# Patient Record
Sex: Female | Born: 1995 | Race: White | Hispanic: No
Health system: Southern US, Community
[De-identification: ages and names within clinical notes are randomized; demographics above are authoritative.]

---

## 2021-02-14 DIAGNOSIS — N3001 Acute cystitis with hematuria: Secondary | ICD-10-CM | POA: Diagnosis not present

## 2021-02-14 DIAGNOSIS — R300 Dysuria: Secondary | ICD-10-CM | POA: Diagnosis not present

## 2021-02-14 DIAGNOSIS — Z113 Encounter for screening for infections with a predominantly sexual mode of transmission: Secondary | ICD-10-CM | POA: Diagnosis not present

## 2021-03-24 DIAGNOSIS — Z0000 Encounter for general adult medical examination without abnormal findings: Secondary | ICD-10-CM | POA: Diagnosis not present

## 2021-04-01 DIAGNOSIS — L720 Epidermal cyst: Secondary | ICD-10-CM | POA: Diagnosis not present

## 2021-04-17 DIAGNOSIS — H66003 Acute suppurative otitis media without spontaneous rupture of ear drum, bilateral: Secondary | ICD-10-CM | POA: Diagnosis not present

## 2021-05-02 DIAGNOSIS — L538 Other specified erythematous conditions: Secondary | ICD-10-CM | POA: Diagnosis not present

## 2021-05-02 DIAGNOSIS — Z789 Other specified health status: Secondary | ICD-10-CM | POA: Diagnosis not present

## 2021-05-02 DIAGNOSIS — L728 Other follicular cysts of the skin and subcutaneous tissue: Secondary | ICD-10-CM | POA: Diagnosis not present

## 2021-05-11 DIAGNOSIS — H6503 Acute serous otitis media, bilateral: Secondary | ICD-10-CM | POA: Diagnosis not present

## 2021-07-16 DIAGNOSIS — B349 Viral infection, unspecified: Secondary | ICD-10-CM | POA: Diagnosis not present

## 2021-07-16 DIAGNOSIS — J029 Acute pharyngitis, unspecified: Secondary | ICD-10-CM | POA: Diagnosis not present

## 2021-07-16 DIAGNOSIS — R509 Fever, unspecified: Secondary | ICD-10-CM | POA: Diagnosis not present

## 2021-07-16 DIAGNOSIS — U071 COVID-19: Secondary | ICD-10-CM | POA: Diagnosis not present

## 2021-12-22 DIAGNOSIS — J069 Acute upper respiratory infection, unspecified: Secondary | ICD-10-CM | POA: Diagnosis not present

## 2021-12-22 DIAGNOSIS — Z03818 Encounter for observation for suspected exposure to other biological agents ruled out: Secondary | ICD-10-CM | POA: Diagnosis not present

## 2021-12-22 DIAGNOSIS — Z20822 Contact with and (suspected) exposure to covid-19: Secondary | ICD-10-CM | POA: Diagnosis not present

## 2021-12-22 DIAGNOSIS — R051 Acute cough: Secondary | ICD-10-CM | POA: Diagnosis not present

## 2022-01-07 DIAGNOSIS — Z202 Contact with and (suspected) exposure to infections with a predominantly sexual mode of transmission: Secondary | ICD-10-CM | POA: Diagnosis not present

## 2022-01-07 DIAGNOSIS — Z681 Body mass index (BMI) 19 or less, adult: Secondary | ICD-10-CM | POA: Diagnosis not present

## 2022-01-07 DIAGNOSIS — B3731 Acute candidiasis of vulva and vagina: Secondary | ICD-10-CM | POA: Diagnosis not present

## 2022-01-07 DIAGNOSIS — R300 Dysuria: Secondary | ICD-10-CM | POA: Diagnosis not present

## 2022-01-18 ENCOUNTER — Emergency Department (HOSPITAL_COMMUNITY)
Admission: EM | Admit: 2022-01-18 | Discharge: 2022-01-18 | Disposition: A | Discharge status: Home / Self Care | Payer: BC Managed Care – PPO | Attending: Emergency Medicine | Admitting: Emergency Medicine

## 2022-01-18 ENCOUNTER — Emergency Department (HOSPITAL_COMMUNITY): Payer: BC Managed Care – PPO

## 2022-01-18 ENCOUNTER — Encounter (HOSPITAL_COMMUNITY): Payer: Self-pay

## 2022-01-18 DIAGNOSIS — S91311S Laceration without foreign body, right foot, sequela: Secondary | ICD-10-CM | POA: Diagnosis not present

## 2022-01-18 DIAGNOSIS — Z23 Encounter for immunization: Secondary | ICD-10-CM | POA: Insufficient documentation

## 2022-01-18 DIAGNOSIS — S91311A Laceration without foreign body, right foot, initial encounter: Secondary | ICD-10-CM | POA: Diagnosis not present

## 2022-01-18 DIAGNOSIS — S99921A Unspecified injury of right foot, initial encounter: Secondary | ICD-10-CM | POA: Diagnosis not present

## 2022-01-18 DIAGNOSIS — W208XXA Other cause of strike by thrown, projected or falling object, initial encounter: Secondary | ICD-10-CM | POA: Diagnosis not present

## 2022-01-18 MED ORDER — TETANUS-DIPHTH-ACELL PERTUSSIS 5-2.5-18.5 LF-MCG/0.5 IM SUSY
0.5000 mL | PREFILLED_SYRINGE | Freq: Once | INTRAMUSCULAR | Status: AC
  Administered 2022-01-18: 0.5 mL via INTRAMUSCULAR
  Filled 2022-01-18: qty 0.5

## 2022-01-18 MED ORDER — OXYCODONE-ACETAMINOPHEN 5-325 MG PO TABS
1.0000 | ORAL_TABLET | Freq: Once | ORAL | Status: AC
  Administered 2022-01-18: 1 via ORAL
  Filled 2022-01-18: qty 1

## 2022-01-18 MED ORDER — LIDOCAINE HCL (PF) 1 % IJ SOLN
10.0000 mL | Freq: Once | INTRAMUSCULAR | Status: AC
  Administered 2022-01-18: 10 mL
  Filled 2022-01-18: qty 30

## 2022-01-18 MED ORDER — BACITRACIN ZINC 500 UNIT/GM EX OINT
TOPICAL_OINTMENT | Freq: Two times a day (BID) | CUTANEOUS | Status: DC
  Administered 2022-01-18: 1 via TOPICAL
  Filled 2022-01-18: qty 0.9

## 2022-01-18 NOTE — ED Triage Notes (Signed)
Pt presents with c/o left 2nd and 3rd toe injury from dropping a piece of wood on her foot. Bleeding is controlled at this time.

## 2022-01-18 NOTE — ED Provider Notes (Signed)
[facility]-EMERGENCY DEPT Provider Note   CSN: [id number] Arrival date & time: 01/18/22  1846     History  Chief Complaint  Patient presents with   Extremity Laceration    Joyce Jacobson is a 26 y.o. female.  Patient with no pertinent past medical history presents today with complaints of foot injury.  She states that immediately prior to arrival earlier today, patient was moving out of her apartment and accidentally dropped a piece of wooden furniture on her right second and third toes. She states that he was wearing open toed sandals at the time.  She endorses significant pain to the second and third toes but has been able to walk with some discomfort. She denies any other injuries or complaints. She is unsure of her last tetanus.  The history is provided by the patient. No language interpreter was used.      Home Medications Prior to Admission medications   Not on File      Allergies    Patient has no known allergies.    Review of Systems   Review of Systems  Skin:  Positive for wound.  All other systems reviewed and are negative.  Physical Exam Updated Vital Signs BP 121/88 (BP Location: Left Arm)   Pulse 79   Temp 98.4 F (36.9 C) (Oral)   Resp 16   LMP 01/11/2022 (Approximate)   SpO2 100%  Physical Exam Vitals and nursing note reviewed.  Constitutional:      General: She is not in acute distress.    Appearance: Normal appearance. She is normal weight. She is not ill-appearing, toxic-appearing or diaphoretic.  HENT:     Head: Normocephalic and atraumatic.  Cardiovascular:     Rate and Rhythm: Normal rate.  Pulmonary:     Effort: Pulmonary effort is normal. No respiratory distress.  Musculoskeletal:        General: Normal range of motion.     Cervical back: Normal range of motion.     Comments: Horizontal laceration noted along the medial surface of DIP joint of the third toe. Laceration with associated skin flap noted to the  anterior surface of the second toe. No nailbed involvement. Distal sensation intact. Full ROM intact with some pain. Capillary refill less than 2 seconds. Bleeding controlled. No visualized foreign body present  Skin:    General: Skin is warm and dry.  Neurological:     General: No focal deficit present.     Mental Status: She is alert.  Psychiatric:        Mood and Affect: Mood normal.        Behavior: Behavior normal.    ED Results / Procedures / Treatments   Labs (all labs ordered are listed, but only abnormal results are displayed) Labs Reviewed - No data to display  EKG None  Radiology DG Foot Complete Right  Result Date: 01/18/2022 CLINICAL DATA:  Dropped heavy piece of furniture on right foot today, pain in the second and third toes. EXAM: RIGHT FOOT COMPLETE - 3+ VIEW COMPARISON:  None Available. FINDINGS: There is no evidence of fracture or dislocation. Particularly, no fracture of the second or third toe. There is no evidence of arthropathy or other focal bone abnormality. There is a small well corticated density in the dorsal foot overlying the metatarsals that is chronic. Soft tissues are unremarkable. IMPRESSION: No fracture or subluxation of the right foot. Electronically Signed   By: Narda Rutherford M.D.   On: 01/18/2022  19:48    Procedures .Marland Kitchen.Laceration Repair  Date/Time: 01/18/2022 11:06 PM Performed by: Silva BandySmoot, Jayke Caul A, PA-C Authorized by: Silva BandySmoot, Becket Wecker A, PA-C   Consent:    Consent obtained:  Verbal   Consent given by:  Patient   Risks, benefits, and alternatives were discussed: yes     Risks discussed:  Infection, pain, retained foreign body, need for additional repair, poor cosmetic result, tendon damage, nerve damage, poor wound healing and vascular damage   Alternatives discussed:  No treatment, delayed treatment, observation and referral Universal protocol:    Procedure explained and questions answered to patient or proxy's satisfaction: yes     Imaging  studies available: yes     Required blood products, implants, devices, and special equipment available: yes     Patient identity confirmed:  Verbally with patient Anesthesia:    Anesthesia method:  Nerve block   Block needle gauge:  25 G   Block anesthetic:  Lidocaine 1% w/o epi   Block technique:  Toe block   Block injection procedure:  Anatomic landmarks identified, introduced needle, incremental injection, anatomic landmarks palpated and negative aspiration for blood   Block outcome:  Anesthesia achieved Laceration details:    Location:  Toe   Toe location:  R second toe   Length (cm):  4   Depth (mm):  3 Exploration:    Imaging obtained: x-ray     Imaging outcome: foreign body not noted     Wound exploration: wound explored through full range of motion and entire depth of wound visualized   Treatment:    Area cleansed with:  Saline   Amount of cleaning:  Extensive   Irrigation solution:  Sterile saline   Irrigation volume:  1 L   Irrigation method:  Pressure wash Skin repair:    Repair method:  Sutures   Suture size:  4-0   Suture material:  Prolene   Suture technique:  Simple interrupted   Number of sutures:  4 Approximation:    Approximation:  Close Repair type:    Repair type:  Simple Post-procedure details:    Dressing:  Antibiotic ointment and non-adherent dressing   Procedure completion:  Tolerated well, no immediate complications .Marland Kitchen.Laceration Repair  Date/Time: 01/18/2022 11:10 PM Performed by: Silva BandySmoot, Bridgitt Raggio A, PA-C Authorized by: Silva BandySmoot, Vandy Fong A, PA-C   Consent:    Consent obtained:  Verbal   Consent given by:  Patient   Risks, benefits, and alternatives were discussed: yes     Risks discussed:  Infection, pain, retained foreign body, tendon damage, vascular damage, poor cosmetic result, poor wound healing, nerve damage and need for additional repair   Alternatives discussed:  No treatment, delayed treatment, referral and observation Universal protocol:     Procedure explained and questions answered to patient or proxy's satisfaction: yes     Imaging studies available: yes     Required blood products, implants, devices, and special equipment available: yes     Patient identity confirmed:  Verbally with patient Anesthesia:    Anesthesia method:  Nerve block   Block needle gauge:  25 G   Block anesthetic:  Lidocaine 1% w/o epi   Block technique:  Digital block   Block injection procedure:  Anatomic landmarks identified, incremental injection, negative aspiration for blood, introduced needle and anatomic landmarks palpated   Block outcome:  Anesthesia achieved Laceration details:    Location:  Toe   Toe location:  R third toe   Length (cm):  3   Depth (  mm):  2 Exploration:    Imaging obtained: x-ray     Imaging outcome: foreign body not noted     Wound exploration: wound explored through full range of motion and entire depth of wound visualized   Treatment:    Area cleansed with:  Saline   Amount of cleaning:  Standard   Irrigation solution:  Sterile saline   Irrigation volume:  500 ml   Irrigation method:  Pressure wash Skin repair:    Repair method:  Sutures   Suture size:  4-0   Suture material:  Prolene   Number of sutures:  3 Approximation:    Approximation:  Close Repair type:    Repair type:  Simple Post-procedure details:    Dressing:  Antibiotic ointment and non-adherent dressing   Procedure completion:  Tolerated well, no immediate complications    Medications Ordered in ED Medications  lidocaine (PF) (XYLOCAINE) 1 % injection 10 mL (has no administration in time range)  bacitracin ointment (has no administration in time range)  Tdap (BOOSTRIX) injection 0.5 mL (0.5 mLs Intramuscular Given 01/18/22 2128)  oxyCODONE-acetaminophen (PERCOCET/ROXICET) 5-325 MG per tablet 1 tablet (1 tablet Oral Given 01/18/22 2145)    ED Course/ Medical Decision Making/ A&P                           Medical Decision Making Amount  and/or Complexity of Data Reviewed Radiology: ordered.  Risk Prescription drug management.   Patient presents today with right second and third toe injuries.  She is afebrile, nontoxic-appearing, and in no acute distress with reassuring vital signs.  X-ray imaging obtained of the right foot which revealed no acute fracture or subluxation of the foot.  I have personally reviewed and interpreted these images and agree with radiology interpretation.  Pressure irrigation performed of toe wounds. Wound explored and base of wound visualized in a bloodless field without evidence of foreign body.  Laceration occurred < 8 hours prior to repair which was well tolerated. Tdap updated.  Pt has no comorbidities to effect normal wound healing. Pt discharged without antibiotics.  Discussed suture home care with patient and answered questions. Pt to follow-up for wound check and suture removal in 7 days; they are to return to the ED sooner for signs of infection. Pt is hemodynamically stable with no complaints prior to dc.  Discharged in stable condition.  Final Clinical Impression(s) / ED Diagnoses Final diagnoses:  Foot laceration, right, sequela    Rx / DC Orders ED Discharge Orders     None     An After Visit Summary was printed and given to the patient.     Vear Clock 01/18/22 2318    Lorre Nick, MD 01/20/22 1505

## 2022-01-18 NOTE — Discharge Instructions (Signed)
As we discussed, your work-up in the ER today was reassuring for acute abnormalities.  X-ray imaging of your foot did not show any fracture or dislocation.  7 nonabsorbable sutures were placed in your second and third toe this evening.  You will need to have these removed in 7 to 10 days.  This can be done at urgent care, your PCP, or you can return here.  In the interim, please ensure that your wounds are kept clean and dry.  You may shower normally and rinse your wounds with soap and water, just be sure to pat dry and do not scrub.  Return if development of any new or worsening symptoms.

## 2022-01-29 DIAGNOSIS — L089 Local infection of the skin and subcutaneous tissue, unspecified: Secondary | ICD-10-CM | POA: Diagnosis not present

## 2022-01-29 DIAGNOSIS — Z4802 Encounter for removal of sutures: Secondary | ICD-10-CM | POA: Diagnosis not present

## 2022-04-10 DIAGNOSIS — Z681 Body mass index (BMI) 19 or less, adult: Secondary | ICD-10-CM | POA: Diagnosis not present

## 2022-04-10 DIAGNOSIS — Z13220 Encounter for screening for lipoid disorders: Secondary | ICD-10-CM | POA: Diagnosis not present

## 2022-04-10 DIAGNOSIS — Z713 Dietary counseling and surveillance: Secondary | ICD-10-CM | POA: Diagnosis not present

## 2022-04-10 DIAGNOSIS — Z136 Encounter for screening for cardiovascular disorders: Secondary | ICD-10-CM | POA: Diagnosis not present

## 2022-04-10 DIAGNOSIS — Z131 Encounter for screening for diabetes mellitus: Secondary | ICD-10-CM | POA: Diagnosis not present

## 2022-05-16 DIAGNOSIS — N898 Other specified noninflammatory disorders of vagina: Secondary | ICD-10-CM | POA: Diagnosis not present

## 2022-05-16 DIAGNOSIS — Z7251 High risk heterosexual behavior: Secondary | ICD-10-CM | POA: Diagnosis not present

## 2022-05-16 DIAGNOSIS — N926 Irregular menstruation, unspecified: Secondary | ICD-10-CM | POA: Diagnosis not present

## 2022-07-26 DIAGNOSIS — J069 Acute upper respiratory infection, unspecified: Secondary | ICD-10-CM | POA: Diagnosis not present

## 2022-07-26 DIAGNOSIS — R051 Acute cough: Secondary | ICD-10-CM | POA: Diagnosis not present

## 2022-07-26 DIAGNOSIS — Z681 Body mass index (BMI) 19 or less, adult: Secondary | ICD-10-CM | POA: Diagnosis not present

## 2022-07-26 DIAGNOSIS — R509 Fever, unspecified: Secondary | ICD-10-CM | POA: Diagnosis not present

## 2022-12-21 IMAGING — CR DG FOOT COMPLETE 3+V*R*
3 series · 3 of 3 positions shown · non-contrast
Comparison: None Available.

CLINICAL DATA: Dropped heavy piece of furniture on right foot
today, pain in the second and third toes.

EXAM:
RIGHT FOOT COMPLETE - 3+ VIEW

[x foot ap left]
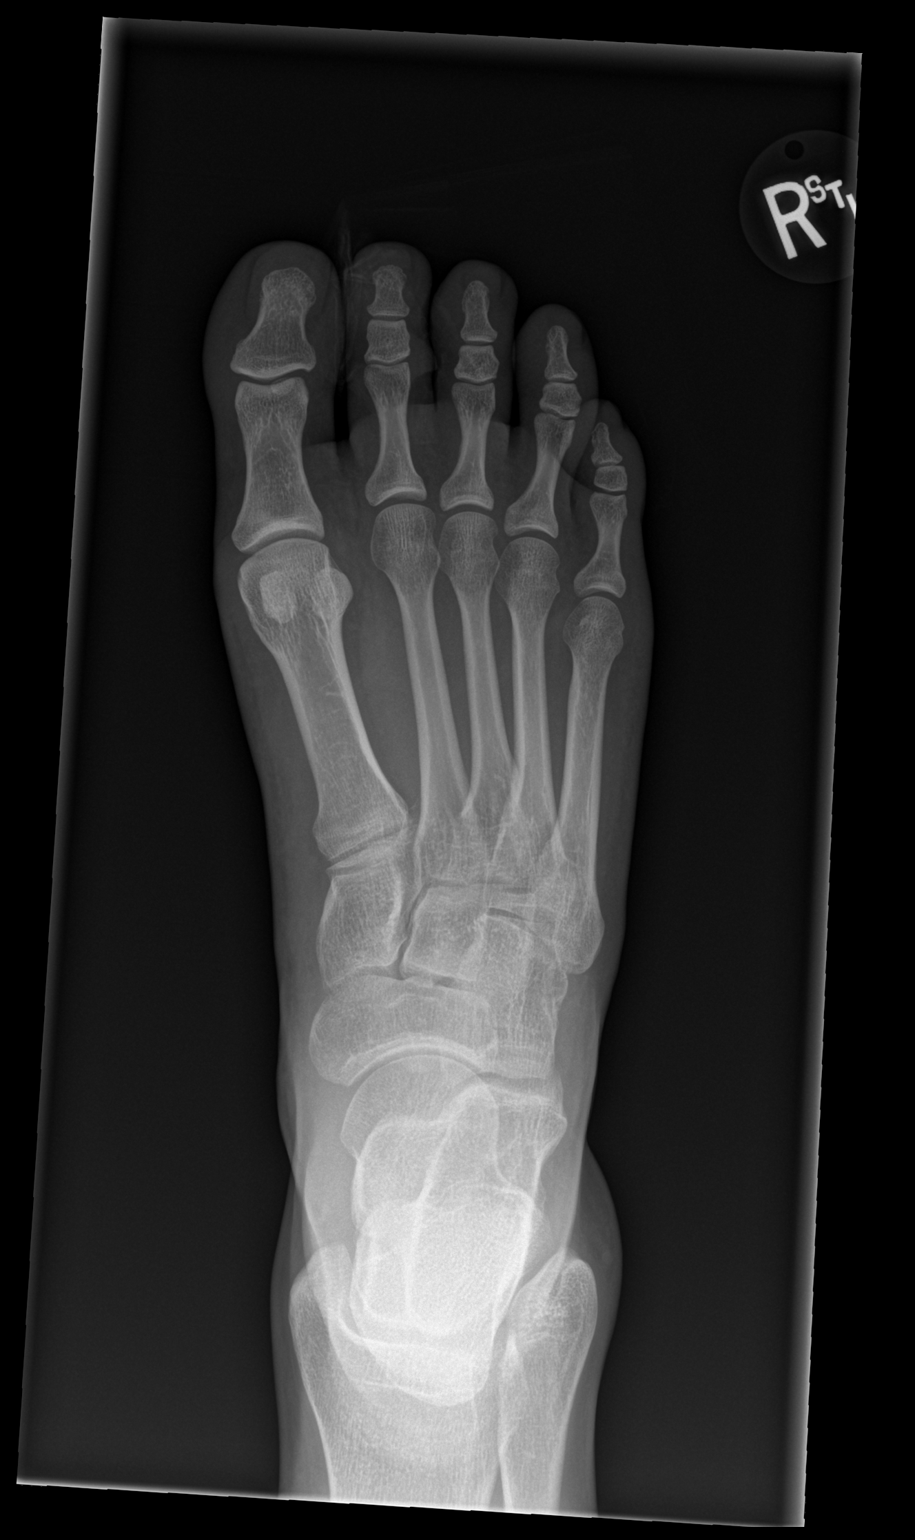

[x foot obl left]
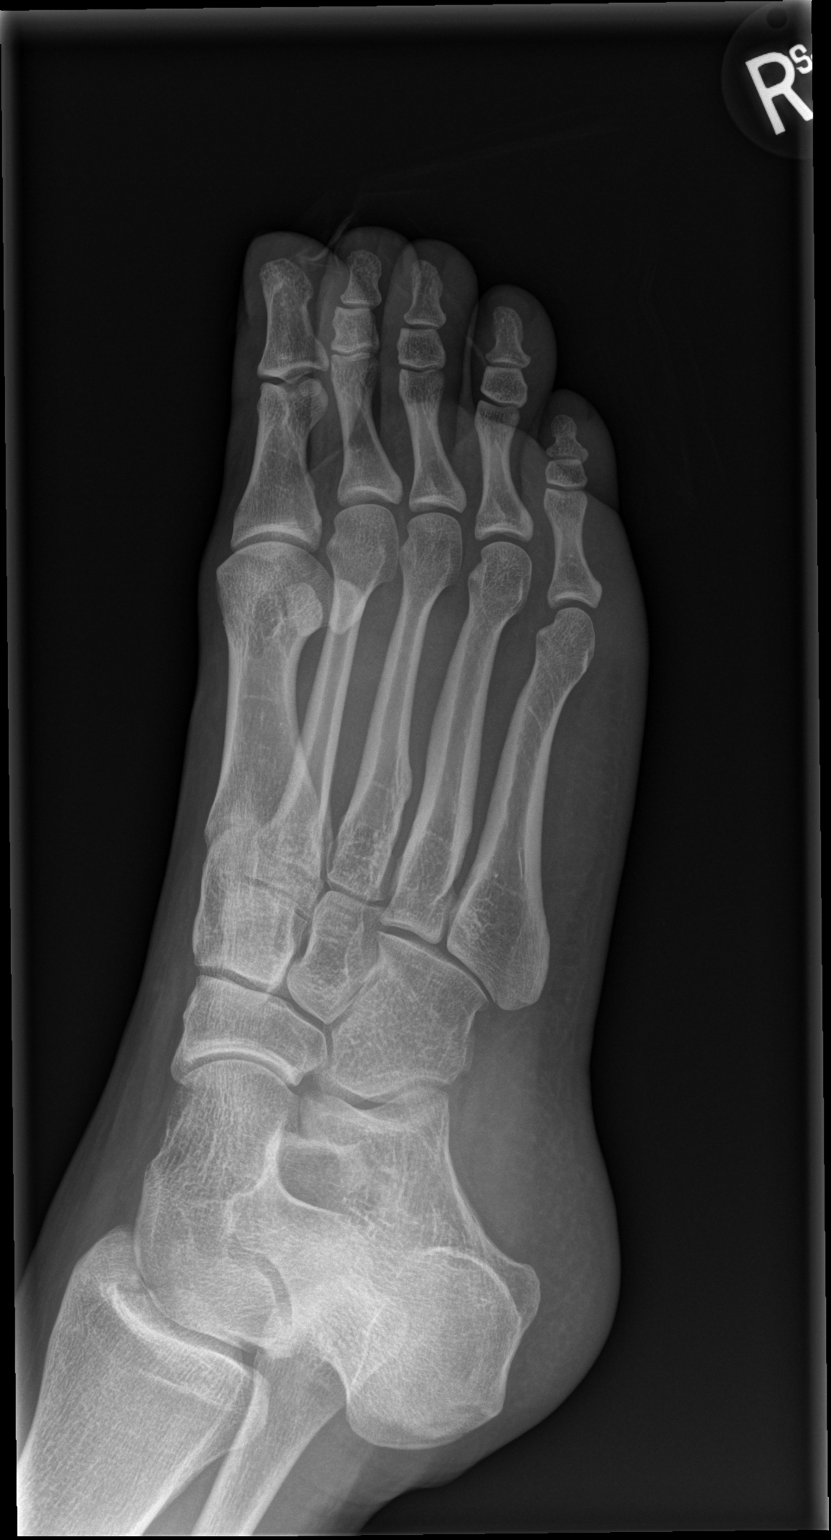

[x foot lat left]
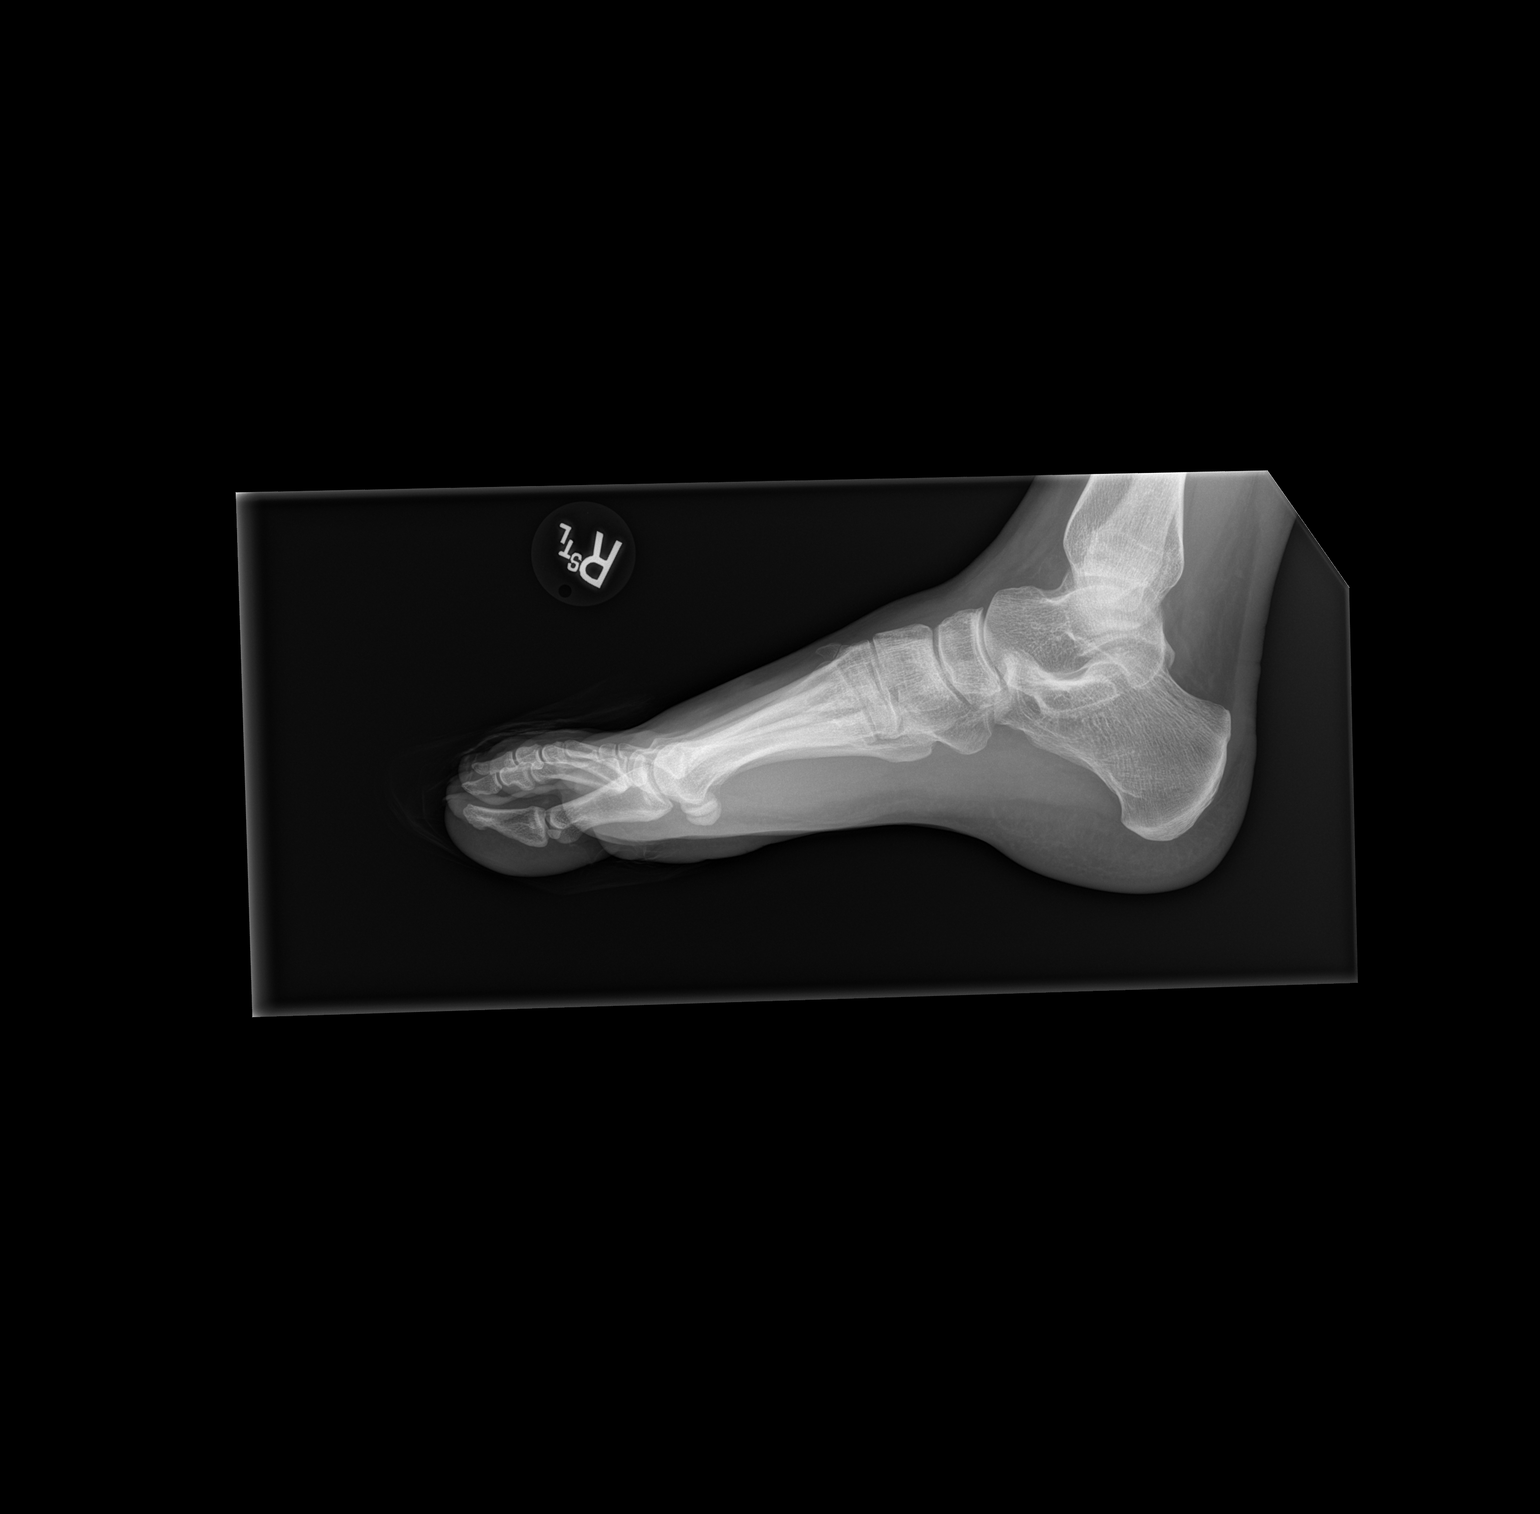

[3 of 3 positions shown; findings below may reference images not displayed]

FINDINGS: There is no evidence of fracture or dislocation. Particularly, no
fracture of the second or third toe. There is no evidence of
arthropathy or other focal bone abnormality. There is a small well
corticated density in the dorsal foot overlying the metatarsals that
is chronic. Soft tissues are unremarkable.
IMPRESSION: No fracture or subluxation of the right foot.

## 2022-12-31 ENCOUNTER — Emergency Department: Payer: Self-pay

## 2022-12-31 ENCOUNTER — Emergency Department
Admission: EM | Admit: 2022-12-31 | Discharge: 2022-12-31 | Disposition: A | Discharge status: Home / Self Care | Payer: Self-pay | Attending: Emergency Medicine | Admitting: Emergency Medicine

## 2022-12-31 ENCOUNTER — Other Ambulatory Visit: Payer: Self-pay

## 2022-12-31 DIAGNOSIS — N83209 Unspecified ovarian cyst, unspecified side: Secondary | ICD-10-CM

## 2022-12-31 DIAGNOSIS — N939 Abnormal uterine and vaginal bleeding, unspecified: Secondary | ICD-10-CM

## 2022-12-31 DIAGNOSIS — N938 Other specified abnormal uterine and vaginal bleeding: Secondary | ICD-10-CM

## 2022-12-31 DIAGNOSIS — N83201 Unspecified ovarian cyst, right side: Secondary | ICD-10-CM | POA: Insufficient documentation

## 2022-12-31 LAB — URINALYSIS, ROUTINE W REFLEX MICROSCOPIC
RBC / HPF: >50 RBC/hpf (ref 0–5)
Specific Gravity, Urine: 1.025 (ref 1.005–1.030)
Squamous Epithelial / HPF: NONE SEEN /HPF (ref 0–5)
WBC, UA: >50 WBC/hpf (ref 0–5)

## 2022-12-31 LAB — BASIC METABOLIC PANEL
Anion gap: 11 (ref 5–15)
BUN: 16 mg/dL (ref 6–20)
CO2: 22 mmol/L (ref 22–32)
Calcium: 8.7 mg/dL — ABNORMAL LOW (ref 8.9–10.3)
Chloride: 105 mmol/L (ref 98–111)
Creatinine, Ser: 1.11 mg/dL — ABNORMAL HIGH (ref 0.44–1.00)
GFR, Estimated: >60 mL/min (ref >60)
Glucose, Bld: 103 mg/dL — ABNORMAL HIGH (ref 70–99)
Potassium: 3.3 mmol/L — ABNORMAL LOW (ref 3.5–5.1)
Sodium: 138 mmol/L (ref 135–145)

## 2022-12-31 LAB — CBC
HCT: 32.5 % — ABNORMAL LOW (ref 36.0–46.0)
Hemoglobin: 11.2 g/dL — ABNORMAL LOW (ref 12.0–15.0)
MCH: 33.5 pg (ref 26.0–34.0)
MCHC: 34.5 g/dL (ref 30.0–36.0)
MCV: 97.3 fL (ref 80.0–100.0)
Platelets: 187 10*3/uL (ref 150–400)
RBC: 3.34 MIL/uL — ABNORMAL LOW (ref 3.87–5.11)
RDW: 13 % (ref 11.5–15.5)
WBC: 11.1 10*3/uL — ABNORMAL HIGH (ref 4.0–10.5)
nRBC: 0 % (ref 0.0–0.2)

## 2022-12-31 LAB — TYPE AND SCREEN
ABO/RH(D): A POS
Antibody Screen: NEGATIVE

## 2022-12-31 LAB — PROTIME-INR
INR: 1 (ref 0.8–1.2)
Prothrombin Time: 13.5 seconds (ref 11.4–15.2)

## 2022-12-31 LAB — HCG, QUANTITATIVE, PREGNANCY: hCG, Beta Chain, Quant, S: <1 m[IU]/mL (ref <5)

## 2022-12-31 MED ORDER — MORPHINE SULFATE (PF) 4 MG/ML IV SOLN
4.0000 mg | Freq: Once | INTRAVENOUS | Status: AC
  Administered 2022-12-31: 4 mg via INTRAVENOUS
  Filled 2022-12-31: qty 1

## 2022-12-31 MED ORDER — KETOROLAC TROMETHAMINE 30 MG/ML IJ SOLN
15.0000 mg | Freq: Once | INTRAMUSCULAR | Status: AC
Start: 2022-12-31 — End: 2022-12-31
  Administered 2022-12-31: 15 mg via INTRAVENOUS
  Filled 2022-12-31: qty 1

## 2022-12-31 MED ORDER — HYDROCODONE-ACETAMINOPHEN 5-325 MG PO TABS
1.0000 | ORAL_TABLET | Freq: Four times a day (QID) | ORAL | 0 refills | Status: AC | PRN
Start: 2022-12-31 — End: 2023-12-31

## 2022-12-31 MED ORDER — SODIUM CHLORIDE 0.9 % IV BOLUS
1000.0000 mL | Freq: Once | INTRAVENOUS | Status: AC
  Administered 2022-12-31: 1000 mL via INTRAVENOUS

## 2022-12-31 MED ORDER — MEDROXYPROGESTERONE ACETATE 10 MG PO TABS: 10.0000 mg | ORAL_TABLET | Freq: Every day | ORAL | 0 refills | Status: AC

## 2022-12-31 MED ORDER — ONDANSETRON HCL 4 MG/2ML IJ SOLN
4.0000 mg | Freq: Once | INTRAMUSCULAR | Status: AC
  Administered 2022-12-31: 4 mg via INTRAVENOUS
  Filled 2022-12-31: qty 2

## 2022-12-31 MED ORDER — ONDANSETRON 4 MG PO TBDP: 4.0000 mg | ORAL_TABLET | Freq: Three times a day (TID) | ORAL | 0 refills | Status: AC | PRN

## 2022-12-31 NOTE — ED Triage Notes (Addendum)
Arrived via EMS from work after syncopal episode while sitting down and talking to friend. Reports 10 days of heavy vaginal bleeding. States she is having lower abdominal cramping. Reports she is currently saturating super maxi pad every 20 minutes with golf ball sized cots. States she is unsure if she is pregnant. AOX4. Resp even,unlabored on RA. Speaking in full clear sentences. 20 G left AC 4 mg zofran 50 mcg fentanyl en route.

## 2022-12-31 NOTE — ED Notes (Signed)
Patient ambulated self to BST and back to bed without incident.

## 2022-12-31 NOTE — ED Provider Notes (Signed)
[facility] Provider Note    Event Date/Time   First MD Initiated Contact with Patient 12/31/22 2022     (approximate)   History   Vaginal Bleeding   HPI  Joyce Jacobson is a 27 y.o. female here with vaginal bleeding.  The patient states that for the last 2 weeks, she has had essentially daily, profuse, vaginal bleeding.  She has been passing large amount of clots, over quarter to grapefruit sized, essentially constantly.  She has been soaking through a pad every 20 minutes for the last several days to weeks.  She states she has been lightheaded.  She syncopized while talking to her friend today which is why she ultimately came to the ED.  She has a history of 1 previous episode like this which resolved spontaneously.  She has a history of multiple prior miscarriages but denies history of easy bruising or bleeding or clotting.  She has a family history of miscarriages.  No family history of bleeding disorders although she says her mother did have blood clots.  She has had some aching, throbbing, contraction-like abdominal pain.     Physical Exam   Triage Vital Signs: ED Triage Vitals  Enc Vitals Group     BP 12/31/22 2016 97/83     Pulse Rate 12/31/22 2013 84     Resp 12/31/22 2013 20     Temp 12/31/22 2013 98.4 F (36.9 C)     Temp Source 12/31/22 2013 Oral     SpO2 12/31/22 2013 100 %     Weight --      Height --      Head Circumference --      Peak Flow --      Pain Score 12/31/22 2015 6     Pain Loc --      Pain Edu? --      Excl. in GC? --     Most recent vital signs: Vitals:   12/31/22 2334 12/31/22 2339  BP:  101/64  Pulse:    Resp:    Temp: (!) 97.5 F (36.4 C)   SpO2:       General: Awake, appears uncomfortable due to pain. CV:  Good peripheral perfusion.  Resp:  Normal work of breathing.  Abd:  No distention.  Moderate suprapubic tenderness.  No rebound or guarding. Other:  Moist mucous membranes.   ED Results /  Procedures / Treatments   Labs (all labs ordered are listed, but only abnormal results are displayed) Labs Reviewed  CBC - Abnormal; Notable for the following components:      Result Value   WBC 11.1 (*)    RBC 3.34 (*)    Hemoglobin 11.2 (*)    HCT 32.5 (*)    All other components within normal limits  BASIC METABOLIC PANEL - Abnormal; Notable for the following components:   Potassium 3.3 (*)    Glucose, Bld 103 (*)    Creatinine, Ser 1.11 (*)    Calcium 8.7 (*)    All other components within normal limits  URINALYSIS, ROUTINE W REFLEX MICROSCOPIC - Abnormal; Notable for the following components:   Color, Urine RED (*)    APPearance TURBID (*)    Glucose, UA   (*)    Value: TEST NOT REPORTED DUE TO COLOR INTERFERENCE OF URINE PIGMENT   Hgb urine dipstick   (*)    Value: TEST NOT REPORTED DUE TO COLOR INTERFERENCE OF URINE PIGMENT   Bilirubin  Urine   (*)    Value: TEST NOT REPORTED DUE TO COLOR INTERFERENCE OF URINE PIGMENT   Ketones, ur   (*)    Value: TEST NOT REPORTED DUE TO COLOR INTERFERENCE OF URINE PIGMENT   Protein, ur   (*)    Value: TEST NOT REPORTED DUE TO COLOR INTERFERENCE OF URINE PIGMENT   Nitrite   (*)    Value: TEST NOT REPORTED DUE TO COLOR INTERFERENCE OF URINE PIGMENT   Leukocytes,Ua   (*)    Value: TEST NOT REPORTED DUE TO COLOR INTERFERENCE OF URINE PIGMENT   Bacteria, UA FEW (*)    All other components within normal limits  PROTIME-INR  HCG, QUANTITATIVE, PREGNANCY  TYPE AND SCREEN     EKG Normal sinus rhythm, trickle rate 83.  PR 152, QRS 1 2, QTc 467.  No acute ST elevations or depressions.   RADIOLOGY [place]: 5.4 cm complex cystic area in right ovary c/w hemorrhagic cyst   I also independently reviewed and agree with radiologist interpretations.   PROCEDURES:  Critical Care performed: No   MEDICATIONS ORDERED IN ED: Medications  sodium chloride 0.9 % bolus 1,000 mL (0 mLs Intravenous Stopped 12/31/22 2134)  ketorolac (TORADOL) 30  MG/ML injection 15 mg (15 mg Intravenous Given 12/31/22 2053)  morphine (PF) 4 MG/ML injection 4 mg (4 mg Intravenous Given 12/31/22 2053)  ondansetron (ZOFRAN) injection 4 mg (4 mg Intravenous Given 12/31/22 2052)     IMPRESSION / MDM / ASSESSMENT AND PLAN / ED COURSE  I reviewed the triage vital signs and the nursing notes.                              Differential diagnosis includes, but is not limited to, anovulatory bleeding, fibroids, hemorrhagic cyst with DUB, unlikely UTI, stone  Patient's presentation is most consistent with acute presentation with potential threat to life or bodily function.  The patient is on the cardiac monitor to evaluate for evidence of arrhythmia and/or significant heart rate changes  27 yo F here with vaginal bleeding and lower abdominal pain. No urinary sx to suggest UTI. Hgb 11.2, no known h/o anemia and pt does report significant bleeding over past few weeks. Will encourage iron supplementation and outpt f/u. Suspect DUB/anovulatory bleeding in setting of hemorrhagic cyst. US obtained and shows hemorrhagic cyst, no torsion, no major free fluid in the abdomen. VS are stable. Labs are otherwise overall reassuring. Pain improved significantly in ED.  Given reassuring labs, vitals, and no evidence of significant ongoing bleeding or anemia, will tx supportively with good return precautions.     FINAL CLINICAL IMPRESSION(S) / ED DIAGNOSES   Final diagnoses:  Vaginal bleeding  Dysfunctional uterine bleeding  Hemorrhagic ovarian cyst     Rx / DC Orders   ED Discharge Orders          Ordered    HYDROcodone-acetaminophen (NORCO/VICODIN) 5-325 MG tablet  Every 6 hours PRN        12/31/22 2323    ondansetron (ZOFRAN-ODT) 4 MG disintegrating tablet  Every 8 hours PRN        12/31/22 2323    medroxyPROGESTERone (PROVERA) 10 MG tablet  Daily        12/31/22 2323             Note:  This document was prepared using Dragon voice recognition software  and may include unintentional dictation errors.   Shaune Pollack, MD  01/01/23 0208  

## 2022-12-31 NOTE — Discharge Instructions (Addendum)
Take Ibuprofen 600 mg every 6-8 hours for moderate pain.  Take the Norco for severe pain as needed.  Take the Provera daily for 10 days. After this, you will have period-like bleeding.

## 2023-07-06 ENCOUNTER — Emergency Department
Admission: EM | Admit: 2023-07-06 | Discharge: 2023-07-06 | Disposition: A | Discharge status: Home / Self Care | Payer: Self-pay | Attending: Emergency Medicine | Admitting: Emergency Medicine

## 2023-07-06 ENCOUNTER — Other Ambulatory Visit: Payer: Self-pay

## 2023-07-06 DIAGNOSIS — O210 Mild hyperemesis gravidarum: Secondary | ICD-10-CM

## 2023-07-06 DIAGNOSIS — Z3A11 11 weeks gestation of pregnancy: Secondary | ICD-10-CM | POA: Insufficient documentation

## 2023-07-06 DIAGNOSIS — D72829 Elevated white blood cell count, unspecified: Secondary | ICD-10-CM | POA: Insufficient documentation

## 2023-07-06 DIAGNOSIS — O219 Vomiting of pregnancy, unspecified: Secondary | ICD-10-CM | POA: Insufficient documentation

## 2023-07-06 LAB — URINALYSIS, ROUTINE W REFLEX MICROSCOPIC
Bilirubin Urine: NEGATIVE
Glucose, UA: NEGATIVE mg/dL
Hgb urine dipstick: NEGATIVE
Ketones, ur: 20 mg/dL — AB
Nitrite: NEGATIVE
Protein, ur: 30 mg/dL — AB
Specific Gravity, Urine: 1.024 (ref 1.005–1.030)
pH: 6 (ref 5.0–8.0)

## 2023-07-06 LAB — CBC
HCT: 39.6 % (ref 36.0–46.0)
Hemoglobin: 14 g/dL (ref 12.0–15.0)
MCH: 33 pg (ref 26.0–34.0)
MCHC: 35.4 g/dL (ref 30.0–36.0)
MCV: 93.4 fL (ref 80.0–100.0)
Platelets: 233 10*3/uL (ref 150–400)
RBC: 4.24 MIL/uL (ref 3.87–5.11)
RDW: 11.8 % (ref 11.5–15.5)
WBC: 13.7 10*3/uL — ABNORMAL HIGH (ref 4.0–10.5)
nRBC: 0 % (ref 0.0–0.2)

## 2023-07-06 LAB — COMPREHENSIVE METABOLIC PANEL
ALT: 14 U/L (ref 0–44)
AST: 40 U/L (ref 15–41)
Albumin: 4.5 g/dL (ref 3.5–5.0)
Alkaline Phosphatase: 44 U/L (ref 38–126)
Anion gap: 9 (ref 5–15)
BUN: 11 mg/dL (ref 6–20)
CO2: 22 mmol/L (ref 22–32)
Calcium: 9.5 mg/dL (ref 8.9–10.3)
Chloride: 102 mmol/L (ref 98–111)
Creatinine, Ser: 0.75 mg/dL (ref 0.44–1.00)
GFR, Estimated: >60 mL/min (ref >60)
Glucose, Bld: 74 mg/dL (ref 70–99)
Potassium: 5 mmol/L (ref 3.5–5.1)
Sodium: 133 mmol/L — ABNORMAL LOW (ref 135–145)
Total Bilirubin: 1.3 mg/dL — ABNORMAL HIGH (ref <1.2)
Total Protein: 8.1 g/dL (ref 6.5–8.1)

## 2023-07-06 LAB — POC URINE PREG, ED: Preg Test, Ur: POSITIVE — AB

## 2023-07-06 LAB — LIPASE, BLOOD: Lipase: 29 U/L (ref 11–51)

## 2023-07-06 MED ORDER — ONDANSETRON HCL 4 MG/2ML IJ SOLN
4.0000 mg | Freq: Once | INTRAMUSCULAR | Status: AC
Start: 2023-07-06 — End: 2023-07-06
  Administered 2023-07-06: 4 mg via INTRAVENOUS
  Filled 2023-07-06: qty 2

## 2023-07-06 MED ORDER — SODIUM CHLORIDE 0.9 % IV SOLN: Freq: Once | INTRAVENOUS | Status: AC

## 2023-07-06 NOTE — ED Triage Notes (Signed)
Pt [redacted] weeks pregnant. Here for N/V. Throwing up zofran. Hasn't kept anything down since midnight.

## 2023-07-06 NOTE — ED Provider Notes (Signed)
   [facility] Provider Note    Event Date/Time   First MD Initiated Contact with Patient 07/06/23 1503     (approximate)  History   Chief Complaint: Emesis  HPI  Joyce Jacobson is a 27 y.o. female G6, P1 [redacted] weeks pregnant who presents to the emergency department for nausea vomiting.  According to the patient she has a history of nausea vomiting throughout this pregnancy is prescribed Zofran ODT at home.  Patient states she tried to take her Zofran today but was too nauseated when she took it and she ended up vomiting.  Was unable to keep down any fluid so she came to the emergency department for evaluation.  Denies any abdominal pain denies any vaginal discharge leakage or bleeding.  Physical Exam   Triage Vital Signs: ED Triage Vitals [07/06/23 1340]  Encounter Vitals Group     BP 107/60     Systolic BP Percentile      Diastolic BP Percentile      Pulse Rate 77     Resp 15     Temp 98.3 F (36.8 C)     Temp Source Oral     SpO2 100 %     Weight      Height      Head Circumference      Peak Flow      Pain Score 2     Pain Loc      Pain Education      Exclude from Growth Chart     Most recent vital signs: Vitals:   07/06/23 1340 07/06/23 1535  BP: 107/60 106/60  Pulse: 77 75  Resp: 15 15  Temp: 98.3 F (36.8 C)   SpO2: 100% 99%    General: Awake, no distress.  CV:  Good peripheral perfusion.  Regular rate and rhythm  Resp:  Normal effort.  Equal breath sounds bilaterally.  Abd:  No distention.  Soft, nontender.  No rebound or guarding.  ED Results / Procedures / Treatments    MEDICATIONS ORDERED IN ED: Medications  0.9 %  sodium chloride infusion ( Intravenous New Bag/Given 07/06/23 1443)  ondansetron (ZOFRAN) injection 4 mg (4 mg Intravenous Given 07/06/23 1444)     IMPRESSION / MDM / ASSESSMENT AND PLAN / ED COURSE  I reviewed the triage vital signs and the nursing notes.  Patient's presentation is most consistent with  acute presentation with potential threat to life or bodily function.  Patient presents to the emergency department for nausea vomiting.  Patient is prescribed ODT Zofran however was too nauseated today to use it, instead vomited shortly after unable to keep down fluids which came to the emergency department.  Patient has received a liter of fluid and IV Zofran in the emergency department, states she is feeling much better.  Her lab work is reassuring urinalysis shows small amount of ketones pregnancy test is positive.  Lipase is normal chemistry including LFTs are normal, CBC shows slight leukocytosis but no other concerning findings.  Given the patient's reassuring workup and as she is clinically improved we will discharge the patient with outpatient OB follow-up.  Patient agreeable to plan.  FINAL CLINICAL IMPRESSION(S) / ED DIAGNOSES   Nausea vomiting of pregnancy  Note:  This document was prepared using Dragon voice recognition software and may include unintentional dictation errors.   Minna Antis, MD 07/06/23 1556

## 2023-09-10 ENCOUNTER — Encounter: Payer: Self-pay | Admitting: Primary Care

## 2023-09-10 DIAGNOSIS — Z331 Pregnant state, incidental: Secondary | ICD-10-CM
